# Patient Record
Sex: Male | Born: 1980 | Race: White | Hispanic: No | Marital: Married | Smoking: Never smoker
Health system: Southern US, Community
[De-identification: ages and names within clinical notes are randomized; demographics above are authoritative.]

## PROBLEM LIST (undated history)

## (undated) DIAGNOSIS — F909 Attention-deficit hyperactivity disorder, unspecified type: Secondary | ICD-10-CM

## (undated) HISTORY — PX: KNEE ARTHROSCOPY W/ ACL RECONSTRUCTION: SHX1858

---
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
## 2004-04-09 ENCOUNTER — Encounter: Admission: RE | Admit: 2004-04-09 | Discharge: 2004-04-09 | Payer: Self-pay | Admitting: Otolaryngology

## 2004-04-12 ENCOUNTER — Encounter: Admission: RE | Admit: 2004-04-12 | Discharge: 2004-04-12 | Payer: Self-pay | Admitting: Otolaryngology

## 2005-07-31 ENCOUNTER — Ambulatory Visit: Payer: Self-pay | Admitting: Cardiovascular Disease

## 2005-07-31 ENCOUNTER — Observation Stay (HOSPITAL_COMMUNITY): Admission: EM | Admit: 2005-07-31 | Discharge: 2005-08-01 | Payer: Self-pay | Admitting: Emergency Medicine

## 2005-10-01 ENCOUNTER — Emergency Department (HOSPITAL_COMMUNITY): Admission: EM | Admit: 2005-10-01 | Discharge: 2005-10-01 | Payer: Self-pay | Admitting: Emergency Medicine

## 2005-10-03 ENCOUNTER — Ambulatory Visit: Payer: Self-pay | Admitting: Family Medicine

## 2005-10-04 ENCOUNTER — Ambulatory Visit: Payer: Self-pay | Admitting: Family Medicine

## 2006-02-28 ENCOUNTER — Emergency Department (HOSPITAL_COMMUNITY): Admission: EM | Admit: 2006-02-28 | Discharge: 2006-02-28 | Payer: Self-pay | Admitting: Emergency Medicine

## 2006-03-02 ENCOUNTER — Encounter: Admission: RE | Admit: 2006-03-02 | Discharge: 2006-03-02 | Payer: Self-pay | Admitting: Gastroenterology

## 2006-03-03 ENCOUNTER — Ambulatory Visit (HOSPITAL_COMMUNITY): Admission: RE | Admit: 2006-03-03 | Discharge: 2006-03-03 | Payer: Self-pay | Admitting: Gastroenterology

## 2006-03-27 ENCOUNTER — Encounter: Admission: RE | Admit: 2006-03-27 | Discharge: 2006-03-27 | Payer: Self-pay | Admitting: Otolaryngology

## 2006-03-28 ENCOUNTER — Ambulatory Visit (HOSPITAL_COMMUNITY): Admission: RE | Admit: 2006-03-28 | Discharge: 2006-03-28 | Payer: Self-pay | Admitting: Gastroenterology

## 2008-04-26 IMAGING — NM NM HEPATO W/GB/PHARM/[PERSON_NAME]
2 series · 12 of 12 positions shown · non-contrast
Comparison: Abdominal ultrasound 03/02/06

CLINICAL DATA: Abdominal pain, nausea and vomiting for approximately 2 months.
NUCLEAR MEDICINE HEPATOBILIARY SCAN WITH EJECTION FRACTION:
TECHNIQUE: Sequential abdominal images were obtained following intravenous injection of radiopharmaceutical.  Sequential images were continued following oral ingestion of 8 oz. half-and-half, and the gallbladder ejection fraction was calculated.
Radiopharmaceutical:  5 mCi 7c-EEm Choletec

[Series 1: he hepatobiliary · 3.21mm/px · 6 of 60 frames shown (1 of 2)]
[frame 6/60]
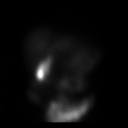
[frame 16/60]
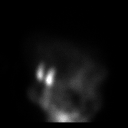
[frame 26/60]
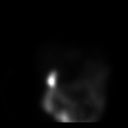
[frame 36/60]
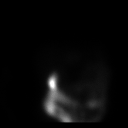
[frame 46/60]
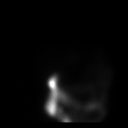
[frame 56/60]
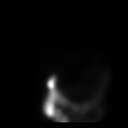

[Series 1: he hepatobiliary · 3.21mm/px · 6 of 46 frames shown (2 of 2)]
[frame 4/46]
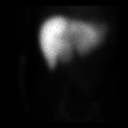
[frame 12/46]
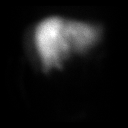
[frame 20/46]
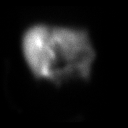
[frame 27/46]
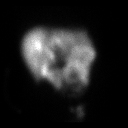
[frame 35/46]
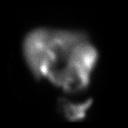
[frame 43/46]
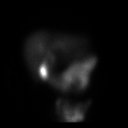

[12 of 12 positions shown; findings below may reference images not displayed]

FINDINGS: There is prompt uptake of the radiotracer within the liver.  Biliary excretion is evident at 11 to 12 minutes.  The gallbladder is visualized at approximately 30 minutes.  No biliary obstruction.  
Half and half cream was administered orally.  The gallbladder ejection fraction was performed at 1 hour.  The calculated gallbladder ejection fraction is 94%; normal value is greater than or equal to 50% ejected at 1 hour.
IMPRESSION: 1.  No acute finding.  Normal hepatobiliary scan.  
2.  Gallbladder ejection fraction calculated at 94%.

## 2011-07-25 ENCOUNTER — Other Ambulatory Visit: Payer: Self-pay | Admitting: Family Medicine

## 2011-07-25 DIAGNOSIS — M25561 Pain in right knee: Secondary | ICD-10-CM

## 2011-07-26 ENCOUNTER — Ambulatory Visit
Admission: RE | Admit: 2011-07-26 | Discharge: 2011-07-26 | Disposition: A | Discharge status: Home / Self Care | Payer: BC Managed Care – PPO | Source: Ambulatory Visit | Attending: Family Medicine | Admitting: Family Medicine

## 2011-07-26 DIAGNOSIS — M25561 Pain in right knee: Secondary | ICD-10-CM

## 2011-08-16 ENCOUNTER — Ambulatory Visit: Payer: BC Managed Care – PPO | Attending: Orthopedic Surgery | Admitting: Physical Therapy

## 2011-08-16 DIAGNOSIS — IMO0001 Reserved for inherently not codable concepts without codable children: Secondary | ICD-10-CM | POA: Insufficient documentation

## 2011-08-16 DIAGNOSIS — R5381 Other malaise: Secondary | ICD-10-CM | POA: Insufficient documentation

## 2011-08-16 DIAGNOSIS — M25669 Stiffness of unspecified knee, not elsewhere classified: Secondary | ICD-10-CM | POA: Insufficient documentation

## 2011-08-16 DIAGNOSIS — M25569 Pain in unspecified knee: Secondary | ICD-10-CM | POA: Insufficient documentation

## 2011-08-17 ENCOUNTER — Ambulatory Visit: Payer: BC Managed Care – PPO | Admitting: Physical Therapy

## 2011-08-18 ENCOUNTER — Ambulatory Visit: Payer: BC Managed Care – PPO | Admitting: *Deleted

## 2011-08-22 ENCOUNTER — Ambulatory Visit: Payer: BC Managed Care – PPO | Admitting: Physical Therapy

## 2011-08-23 ENCOUNTER — Ambulatory Visit: Payer: BC Managed Care – PPO | Admitting: Physical Therapy

## 2011-08-24 ENCOUNTER — Ambulatory Visit: Payer: BC Managed Care – PPO | Admitting: Physical Therapy

## 2011-08-31 ENCOUNTER — Ambulatory Visit: Payer: BC Managed Care – PPO | Attending: Orthopedic Surgery | Admitting: Physical Therapy

## 2011-08-31 DIAGNOSIS — M25669 Stiffness of unspecified knee, not elsewhere classified: Secondary | ICD-10-CM | POA: Insufficient documentation

## 2011-08-31 DIAGNOSIS — M25569 Pain in unspecified knee: Secondary | ICD-10-CM | POA: Insufficient documentation

## 2011-08-31 DIAGNOSIS — IMO0001 Reserved for inherently not codable concepts without codable children: Secondary | ICD-10-CM | POA: Insufficient documentation

## 2011-08-31 DIAGNOSIS — R5381 Other malaise: Secondary | ICD-10-CM | POA: Insufficient documentation

## 2011-09-01 ENCOUNTER — Ambulatory Visit: Payer: BC Managed Care – PPO | Admitting: *Deleted

## 2011-09-06 ENCOUNTER — Ambulatory Visit: Payer: BC Managed Care – PPO | Admitting: *Deleted

## 2011-09-08 ENCOUNTER — Ambulatory Visit: Payer: BC Managed Care – PPO | Admitting: *Deleted

## 2011-09-13 ENCOUNTER — Ambulatory Visit: Payer: BC Managed Care – PPO | Admitting: *Deleted

## 2011-09-15 ENCOUNTER — Ambulatory Visit: Payer: BC Managed Care – PPO | Admitting: *Deleted

## 2011-09-20 ENCOUNTER — Ambulatory Visit: Payer: BC Managed Care – PPO | Admitting: *Deleted

## 2011-09-22 ENCOUNTER — Ambulatory Visit: Payer: BC Managed Care – PPO | Admitting: *Deleted

## 2011-09-27 ENCOUNTER — Ambulatory Visit: Payer: BC Managed Care – PPO | Attending: Orthopedic Surgery | Admitting: *Deleted

## 2011-09-27 DIAGNOSIS — R5381 Other malaise: Secondary | ICD-10-CM | POA: Insufficient documentation

## 2011-09-27 DIAGNOSIS — IMO0001 Reserved for inherently not codable concepts without codable children: Secondary | ICD-10-CM | POA: Insufficient documentation

## 2011-09-27 DIAGNOSIS — M25669 Stiffness of unspecified knee, not elsewhere classified: Secondary | ICD-10-CM | POA: Insufficient documentation

## 2011-09-27 DIAGNOSIS — M25569 Pain in unspecified knee: Secondary | ICD-10-CM | POA: Insufficient documentation

## 2011-09-29 ENCOUNTER — Ambulatory Visit: Payer: BC Managed Care – PPO | Admitting: *Deleted

## 2011-10-04 ENCOUNTER — Ambulatory Visit: Payer: BC Managed Care – PPO | Admitting: Physical Therapy

## 2011-10-05 ENCOUNTER — Ambulatory Visit: Payer: BC Managed Care – PPO | Admitting: Physical Therapy

## 2011-10-06 ENCOUNTER — Encounter: Payer: BC Managed Care – PPO | Admitting: *Deleted

## 2011-10-11 ENCOUNTER — Ambulatory Visit: Payer: BC Managed Care – PPO | Admitting: *Deleted

## 2011-10-13 ENCOUNTER — Ambulatory Visit: Payer: BC Managed Care – PPO | Admitting: *Deleted

## 2011-10-18 ENCOUNTER — Encounter: Payer: BC Managed Care – PPO | Admitting: *Deleted

## 2011-10-20 ENCOUNTER — Encounter: Payer: BC Managed Care – PPO | Admitting: *Deleted

## 2012-11-02 ENCOUNTER — Encounter: Payer: Self-pay | Admitting: Nurse Practitioner

## 2012-11-02 ENCOUNTER — Ambulatory Visit (INDEPENDENT_AMBULATORY_CARE_PROVIDER_SITE_OTHER): Payer: 59 | Admitting: Nurse Practitioner

## 2012-11-02 VITALS — BP 100/71 | HR 69 | Temp 96.4°F | Ht 69.0 in | Wt 134.0 lb

## 2012-11-02 DIAGNOSIS — H60399 Other infective otitis externa, unspecified ear: Secondary | ICD-10-CM

## 2012-11-02 DIAGNOSIS — H6092 Unspecified otitis externa, left ear: Secondary | ICD-10-CM

## 2012-11-02 MED ORDER — CIPROFLOXACIN-DEXAMETHASONE 0.3-0.1 % OT SUSP: 4.0000 [drp] | Freq: Two times a day (BID) | OTIC | Status: DC

## 2012-11-02 NOTE — Progress Notes (Signed)
  Subjective:    Patient ID: Damon Martinez, male    DOB: 09-Aug-1980, 32 y.o.   MRN: [id number]  HPI- Patient in C/O Bilateral ear pain, nasal congestion and cough. Has had ear surgery 2 times and has not "drainage tubes" in ears. Says when he is really stopped up he will throw up in the mornings. No real cough that makes him throw up.    Review of Systems  Constitutional: Negative for fever and chills.  HENT: Positive for ear pain, congestion and postnasal drip.   Respiratory: Positive for cough (slight).   Gastrointestinal: Positive for nausea and vomiting.       Objective:   Physical Exam  Constitutional: He is oriented to person, place, and time. He appears well-developed and well-nourished.  HENT:  Right Ear: Hearing, external ear and ear canal normal. Tympanic membrane is scarred.  Left Ear: Hearing normal. No swelling. Left ear drainage: yellowish excudate in ear canal. Can't visualize TM D/T excudate.  Nose: Rhinorrhea present.  Mouth/Throat: Posterior oropharyngeal erythema: mild.  Eyes: EOM are normal. Pupils are equal, round, and reactive to light.  Neck: Normal range of motion. Neck supple. No thyromegaly present.  Cardiovascular: Normal rate, normal heart sounds and intact distal pulses.   Pulmonary/Chest: Effort normal and breath sounds normal.  Musculoskeletal: Normal range of motion.  Neurological: He is alert and oriented to person, place, and time.  Skin: Skin is warm.   BP 100/71  Pulse 69  Temp(Src) 96.4 F (35.8 C) (Oral)  Ht 5\' 9"  (1.753 m)  Wt 134 lb (60.782 kg)  BMI 19.78 kg/m2        Assessment & Plan:  1. OE (otitis externa), left Avoid getting water in ears Keep appointment with ENT - ciprofloxacin-dexamethasone (CIPRODEX) otic suspension; Place 4 drops into both ears 2 (two) times daily.  Dispense: 7.5 mL; Refill: 0  Mary-Margaret Daphine Deutscher, FNP

## 2013-01-18 ENCOUNTER — Ambulatory Visit (INDEPENDENT_AMBULATORY_CARE_PROVIDER_SITE_OTHER): Payer: 59 | Admitting: Family Medicine

## 2013-01-18 ENCOUNTER — Encounter: Payer: Self-pay | Admitting: Family Medicine

## 2013-01-18 VITALS — BP 108/70 | HR 87 | Temp 97.5°F | Ht 68.5 in | Wt 132.6 lb

## 2013-01-18 DIAGNOSIS — K219 Gastro-esophageal reflux disease without esophagitis: Secondary | ICD-10-CM

## 2013-01-18 DIAGNOSIS — J309 Allergic rhinitis, unspecified: Secondary | ICD-10-CM

## 2013-01-18 MED ORDER — FLUTICASONE PROPIONATE 50 MCG/ACT NA SUSP: 2.0000 | Freq: Every day | NASAL | Status: AC

## 2013-01-18 NOTE — Patient Instructions (Signed)
Gastroesophageal Reflux Disease, Adult  Gastroesophageal reflux disease (GERD) happens when acid from your stomach flows up into the esophagus. When acid comes in contact with the esophagus, the acid causes soreness (inflammation) in the esophagus. Over time, GERD may create small holes (ulcers) in the lining of the esophagus.  CAUSES   · Increased body weight. This puts pressure on the stomach, making acid rise from the stomach into the esophagus.  · Smoking. This increases acid production in the stomach.  · Drinking alcohol. This causes decreased pressure in the lower esophageal sphincter (valve or ring of muscle between the esophagus and stomach), allowing acid from the stomach into the esophagus.  · Late evening meals and a full stomach. This increases pressure and acid production in the stomach.  · A malformed lower esophageal sphincter.  Sometimes, no cause is found.  SYMPTOMS   · Burning pain in the lower part of the mid-chest behind the breastbone and in the mid-stomach area. This may occur twice a week or more often.  · Trouble swallowing.  · Sore throat.  · Dry cough.  · Asthma-like symptoms including chest tightness, shortness of breath, or wheezing.  DIAGNOSIS   Your caregiver may be able to diagnose GERD based on your symptoms. In some cases, X-rays and other tests may be done to check for complications or to check the condition of your stomach and esophagus.  TREATMENT   Your caregiver may recommend over-the-counter or prescription medicines to help decrease acid production. Ask your caregiver before starting or adding any new medicines.   HOME CARE INSTRUCTIONS   · Change the factors that you can control. Ask your caregiver for guidance concerning weight loss, quitting smoking, and alcohol consumption.  · Avoid foods and drinks that make your symptoms worse, such as:  · Caffeine or alcoholic drinks.  · Chocolate.  · Peppermint or mint flavorings.  · Garlic and onions.  · Spicy foods.  · Citrus fruits,  such as oranges, lemons, or limes.  · Tomato-based foods such as sauce, chili, salsa, and pizza.  · Fried and fatty foods.  · Avoid lying down for the 3 hours prior to your bedtime or prior to taking a nap.  · Eat small, frequent meals instead of large meals.  · Wear loose-fitting clothing. Do not wear anything tight around your waist that causes pressure on your stomach.  · Raise the head of your bed 6 to 8 inches with wood blocks to help you sleep. Extra pillows will not help.  · Only take over-the-counter or prescription medicines for pain, discomfort, or fever as directed by your caregiver.  · Do not take aspirin, ibuprofen, or other nonsteroidal anti-inflammatory drugs (NSAIDs).  SEEK IMMEDIATE MEDICAL CARE IF:   · You have pain in your arms, neck, jaw, teeth, or back.  · Your pain increases or changes in intensity or duration.  · You develop nausea, vomiting, or sweating (diaphoresis).  · You develop shortness of breath, or you faint.  · Your vomit is green, yellow, black, or looks like coffee grounds or blood.  · Your stool is red, bloody, or black.  These symptoms could be signs of other problems, such as heart disease, gastric bleeding, or esophageal bleeding.  MAKE SURE YOU:   · Understand these instructions.  · Will watch your condition.  · Will get help right away if you are not doing well or get worse.  Document Released: 03/23/2005 Document Revised: 09/05/2011 Document Reviewed: 12/31/2010  [employer]® Patient   Information ©2014 [employer].

## 2013-01-18 NOTE — Progress Notes (Signed)
  Subjective:    Patient ID: EMORY GALLENTINE, male    DOB: 1981-02-22, 32 y.o.   MRN: [id number]  HPI This 32 y.o. male presents for evaluation of feeling sick in the am.  He states He coughs and spits up a lot of mucus in hte am.  He has been rx'd phenergan Which helps with the nausea.  He gets heartburn.  He has allergies.  He has hx of righ inguinal hernia as child and it is bothering him again..   Review of Systems No chest pain, SOB, HA, dizziness, vision change, N/V, diarrhea, constipation, dysuria, urinary urgency or frequency, myalgias, arthralgias or rash.     Objective:   Physical Exam Vital signs noted  Well developed well nourished male.  HEENT - Head atraumatic Normocephalic                Eyes - PERRLA, Conjuctiva - clear Sclera- Clear EOMI                Ears - EAC's Wnl TM's Wnl Gross Hearing WNL                Nose - Nares patent                 Throat - oropharanx wnl Respiratory - Lungs CTA bilateral Cardiac - RRR S1 and S2 without murmur GI - Abdomen soft Nontender and bowel sounds active x 4 Extremities - No edema. Neuro - Grossly intact.       Assessment & Plan:  GERD (gastroesophageal reflux disease) Continue Prilosec otc  Allergic rhinitis - Flonase NS one to two sprays per nostril qd

## 2016-08-22 ENCOUNTER — Emergency Department (HOSPITAL_COMMUNITY)
Admission: EM | Admit: 2016-08-22 | Discharge: 2016-08-22 | Disposition: A | Discharge status: Home / Self Care | Payer: BLUE CROSS/BLUE SHIELD | Attending: Emergency Medicine | Admitting: Emergency Medicine

## 2016-08-22 ENCOUNTER — Encounter (HOSPITAL_COMMUNITY): Payer: Self-pay

## 2016-08-22 ENCOUNTER — Emergency Department (HOSPITAL_COMMUNITY): Payer: BLUE CROSS/BLUE SHIELD

## 2016-08-22 DIAGNOSIS — Z79899 Other long term (current) drug therapy: Secondary | ICD-10-CM | POA: Insufficient documentation

## 2016-08-22 DIAGNOSIS — F909 Attention-deficit hyperactivity disorder, unspecified type: Secondary | ICD-10-CM | POA: Insufficient documentation

## 2016-08-22 DIAGNOSIS — N132 Hydronephrosis with renal and ureteral calculous obstruction: Secondary | ICD-10-CM | POA: Diagnosis not present

## 2016-08-22 DIAGNOSIS — M545 Low back pain: Secondary | ICD-10-CM | POA: Diagnosis present

## 2016-08-22 DIAGNOSIS — N200 Calculus of kidney: Secondary | ICD-10-CM

## 2016-08-22 HISTORY — DX: Attention-deficit hyperactivity disorder, unspecified type: F90.9

## 2016-08-22 LAB — COMPREHENSIVE METABOLIC PANEL
ALT: 18 U/L (ref 17–63)
AST: 21 U/L (ref 15–41)
Albumin: 4.2 g/dL (ref 3.5–5.0)
Alkaline Phosphatase: 20 U/L — ABNORMAL LOW (ref 38–126)
Anion gap: 6 (ref 5–15)
BUN: 21 mg/dL — ABNORMAL HIGH (ref 6–20)
CO2: 25 mmol/L (ref 22–32)
Calcium: 9 mg/dL (ref 8.9–10.3)
Chloride: 109 mmol/L (ref 101–111)
Creatinine, Ser: 0.97 mg/dL (ref 0.61–1.24)
GFR calc Af Amer: >60 mL/min (ref >60)
GFR calc non Af Amer: >60 mL/min (ref >60)
Glucose, Bld: 100 mg/dL — ABNORMAL HIGH (ref 65–99)
Potassium: 3.5 mmol/L (ref 3.5–5.1)
Sodium: 140 mmol/L (ref 135–145)
Total Bilirubin: 0.5 mg/dL (ref 0.3–1.2)
Total Protein: 6.9 g/dL (ref 6.5–8.1)

## 2016-08-22 LAB — CBC
HCT: 34.9 % — ABNORMAL LOW (ref 39.0–52.0)
Hemoglobin: 11.8 g/dL — ABNORMAL LOW (ref 13.0–17.0)
MCH: 29.3 pg (ref 26.0–34.0)
MCHC: 33.8 g/dL (ref 30.0–36.0)
MCV: 86.6 fL (ref 78.0–100.0)
Platelets: 152 10*3/uL (ref 150–400)
RBC: 4.03 MIL/uL — ABNORMAL LOW (ref 4.22–5.81)
RDW: 13.6 % (ref 11.5–15.5)
WBC: 6 10*3/uL (ref 4.0–10.5)

## 2016-08-22 MED ORDER — KETOROLAC TROMETHAMINE 15 MG/ML IJ SOLN
15.0000 mg | Freq: Once | INTRAMUSCULAR | Status: AC
  Administered 2016-08-22: 15 mg via INTRAVENOUS
  Filled 2016-08-22: qty 1

## 2016-08-22 MED ORDER — HYDROCODONE-ACETAMINOPHEN 5-325 MG PO TABS: 2.0000 | ORAL_TABLET | ORAL | 0 refills | Status: AC | PRN

## 2016-08-22 MED ORDER — NAPROXEN 500 MG PO TABS: 500.0000 mg | ORAL_TABLET | Freq: Two times a day (BID) | ORAL | 0 refills | Status: AC

## 2016-08-22 MED ORDER — ONDANSETRON 4 MG PO TBDP: 4.0000 mg | ORAL_TABLET | Freq: Three times a day (TID) | ORAL | 0 refills | Status: AC | PRN

## 2016-08-22 NOTE — ED Notes (Signed)
Patient denies pain and is resting comfortably.  
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
## 2016-08-22 NOTE — ED Notes (Signed)
PT have been made aware of urine sample.unable to go at this time

## 2016-08-22 NOTE — ED Triage Notes (Signed)
Right flank pain with nausea and vomiting no fever and pain in scrotum voiced with sweating. Family hx of kidney stones.

## 2016-08-22 NOTE — ED Notes (Signed)
Asked pt if he could give a urine sample, but said he's not able to. 

## 2016-08-22 NOTE — Discharge Instructions (Signed)
Please use Norco and naproxen as needed for pain. Drink at least 8 glasses of water throughout the day. Take Zofran as needed for nausea and vomiting. See dietary guidelines attached for help with preventing kidney stones. Please follow up with primary care provider in 2-5 days regarding today's visit.   Get help right away if: You have a fever or chills. You develop severe pain. You develop new abdominal pain. You faint. You are unable to urinate.

## 2019-09-19 ENCOUNTER — Ambulatory Visit: Payer: BC Managed Care – PPO | Attending: Family

## 2019-09-19 DIAGNOSIS — Z23 Encounter for immunization: Secondary | ICD-10-CM

## 2019-09-19 NOTE — Progress Notes (Signed)
   Covid-19 Vaccination Clinic  Name:  KJELL BRANNEN    MRN: [id number] DOB: 08/28/1980  09/19/2019  Mr. Read was observed post Covid-19 immunization for 15 minutes without incident. He was provided with Vaccine Information Sheet and instruction to access the V-Safe system.   Mr. Esty was instructed to call 911 with any severe reactions post vaccine: Marland Kitchen Difficulty breathing  . Swelling of face and throat  . A fast heartbeat  . A bad rash all over body  . Dizziness and weakness   Immunizations Administered    Name Date Dose VIS Date Route   Moderna COVID-19 Vaccine 09/19/2019  3:35 PM 0.5 mL 05/28/2019 Intramuscular   Manufacturer: Moderna   Lot: [id number]   NDC: 01484-039-79

## 2019-10-22 ENCOUNTER — Ambulatory Visit: Payer: BC Managed Care – PPO | Attending: Family

## 2019-10-22 DIAGNOSIS — Z23 Encounter for immunization: Secondary | ICD-10-CM

## 2019-10-22 NOTE — Progress Notes (Signed)
   Covid-19 Vaccination Clinic  Name:  [name]    MRN: [id number] DOB: [birth year]  10/22/2019  Mr. Chizmar was observed post Covid-19 immunization for 15 minutes without incident. He was provided with Vaccine Information Sheet and instruction to access the V-Safe system.   Mr. Isip was instructed to call 911 with any severe reactions post vaccine: Marland Kitchen Difficulty breathing  . Swelling of face and throat  . A fast heartbeat  . A bad rash all over body  . Dizziness and weakness   Immunizations Administered    Name Date Dose VIS Date Route   Moderna COVID-19 Vaccine 10/22/2019  3:52 PM 0.5 mL 05/2019 Intramuscular   Manufacturer: Moderna   Lot: [id number]   NDC: 98473-085-69

## 2020-07-08 ENCOUNTER — Other Ambulatory Visit: Payer: BC Managed Care – PPO

## 2020-07-08 DIAGNOSIS — Z20822 Contact with and (suspected) exposure to covid-19: Secondary | ICD-10-CM

## 2020-07-10 LAB — SARS-COV-2, NAA 2 DAY TAT

## 2020-07-10 LAB — NOVEL CORONAVIRUS, NAA: SARS-CoV-2, NAA: DETECTED — AB

## 2021-06-29 ENCOUNTER — Encounter (HOSPITAL_COMMUNITY): Payer: Self-pay

## 2021-06-29 ENCOUNTER — Other Ambulatory Visit: Payer: Self-pay

## 2021-06-29 ENCOUNTER — Emergency Department (HOSPITAL_COMMUNITY): Payer: BC Managed Care – PPO

## 2021-06-29 ENCOUNTER — Emergency Department (HOSPITAL_COMMUNITY)
Admission: EM | Admit: 2021-06-29 | Discharge: 2021-06-29 | Disposition: A | Discharge status: Home / Self Care | Payer: BC Managed Care – PPO | Attending: Emergency Medicine | Admitting: Emergency Medicine

## 2021-06-29 DIAGNOSIS — S4992XA Unspecified injury of left shoulder and upper arm, initial encounter: Secondary | ICD-10-CM | POA: Diagnosis present

## 2021-06-29 DIAGNOSIS — R0602 Shortness of breath: Secondary | ICD-10-CM | POA: Diagnosis not present

## 2021-06-29 DIAGNOSIS — S42032A Displaced fracture of lateral end of left clavicle, initial encounter for closed fracture: Secondary | ICD-10-CM | POA: Diagnosis not present

## 2021-06-29 DIAGNOSIS — Y92410 Unspecified street and highway as the place of occurrence of the external cause: Secondary | ICD-10-CM | POA: Insufficient documentation

## 2021-06-29 MED ORDER — HYDROCODONE-ACETAMINOPHEN 5-325 MG PO TABS: 2.0000 | ORAL_TABLET | Freq: Four times a day (QID) | ORAL | 0 refills | Status: AC | PRN

## 2021-06-29 MED ORDER — HYDROCODONE-ACETAMINOPHEN 5-325 MG PO TABS
2.0000 | ORAL_TABLET | Freq: Once | ORAL | Status: AC
  Administered 2021-06-29: 2 via ORAL
  Filled 2021-06-29: qty 2

## 2021-06-29 MED ORDER — IBUPROFEN 800 MG PO TABS: 800.0000 mg | ORAL_TABLET | Freq: Four times a day (QID) | ORAL | 0 refills | Status: AC | PRN
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
## 2021-06-29 NOTE — Progress Notes (Signed)
Orthopedic Tech Progress Note Patient Details:  Damon Martinez May 22, 1981 [id number]  Ortho Devices Type of Ortho Device: Shoulder immobilizer Ortho Device/Splint Location: LUE Ortho Device/Splint Interventions: Ordered, Application, Adjustment   Post Interventions Patient Tolerated: Well Instructions Provided: Care of device  [name] 06/29/2021, 11:22 AM

## 2021-06-29 NOTE — ED Notes (Signed)
Ortho tech called to request left shoulder sling

## 2021-06-29 NOTE — Discharge Instructions (Addendum)
Noco ever 6 hours for severe pain. Do not drive on this medication. Motrin every 6 hours for moderate pain Keep left shoulder immobilizer on Follow up with orthopedic surgeon provider this week
